# Patient Record
Sex: Male | Born: 1995 | Race: Black or African American | Hispanic: No | Marital: Single | Smoking: Never smoker
Health system: Southern US, Community
[De-identification: ages and names within clinical notes are randomized; demographics above are authoritative.]

---
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
## 2017-01-28 IMAGING — DX DG FINGER MIDDLE 2+V*R*
3 series · 3 of 3 positions shown · non-contrast
Comparison: None.

CLINICAL DATA: Basketball injury, right middle finger trauma, pain

EXAM:
RIGHT MIDDLE FINGER 2+V

[finger ap]
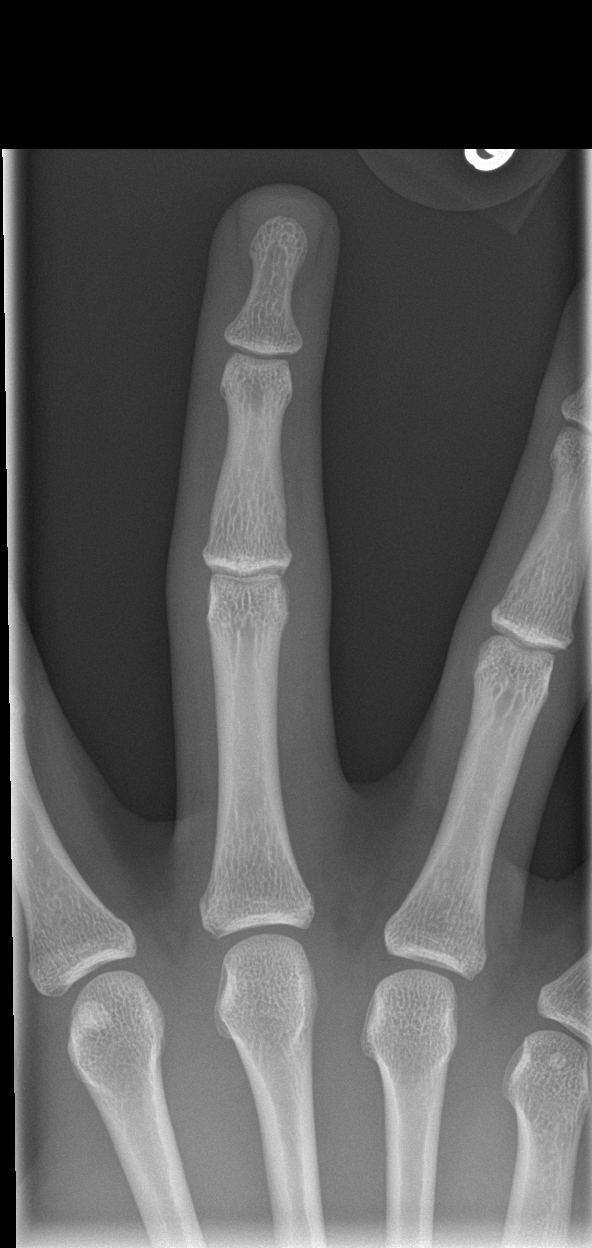

[finger obl]
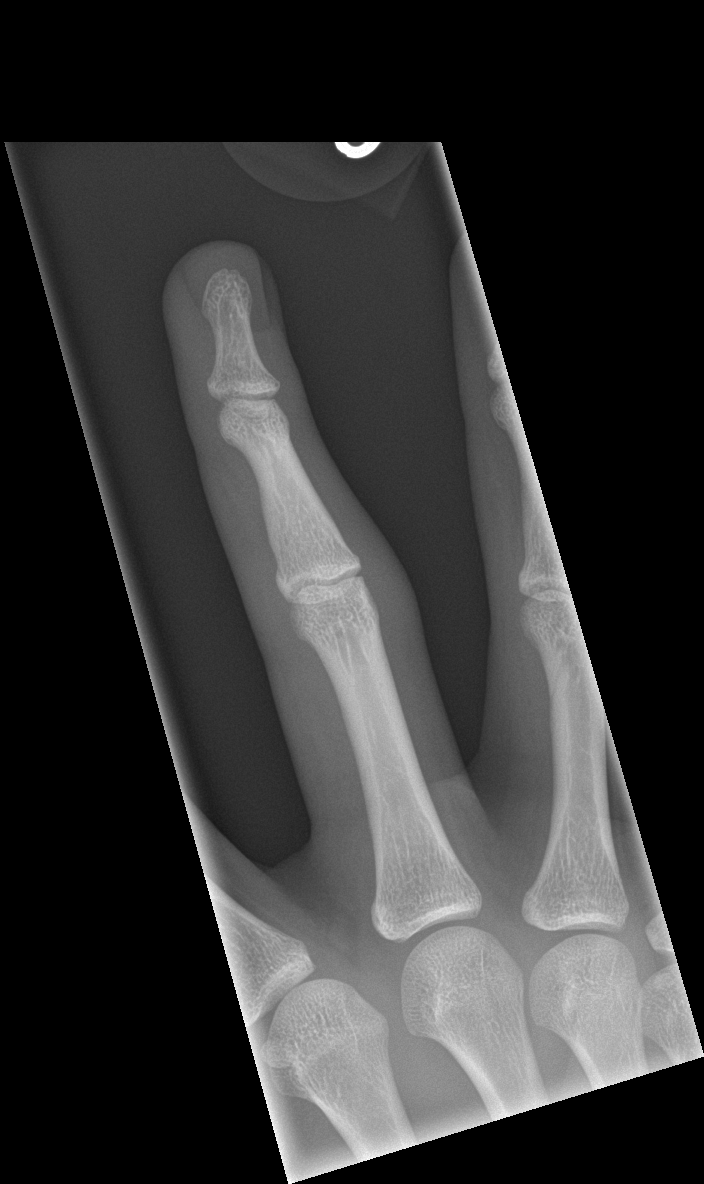

[finger lat]
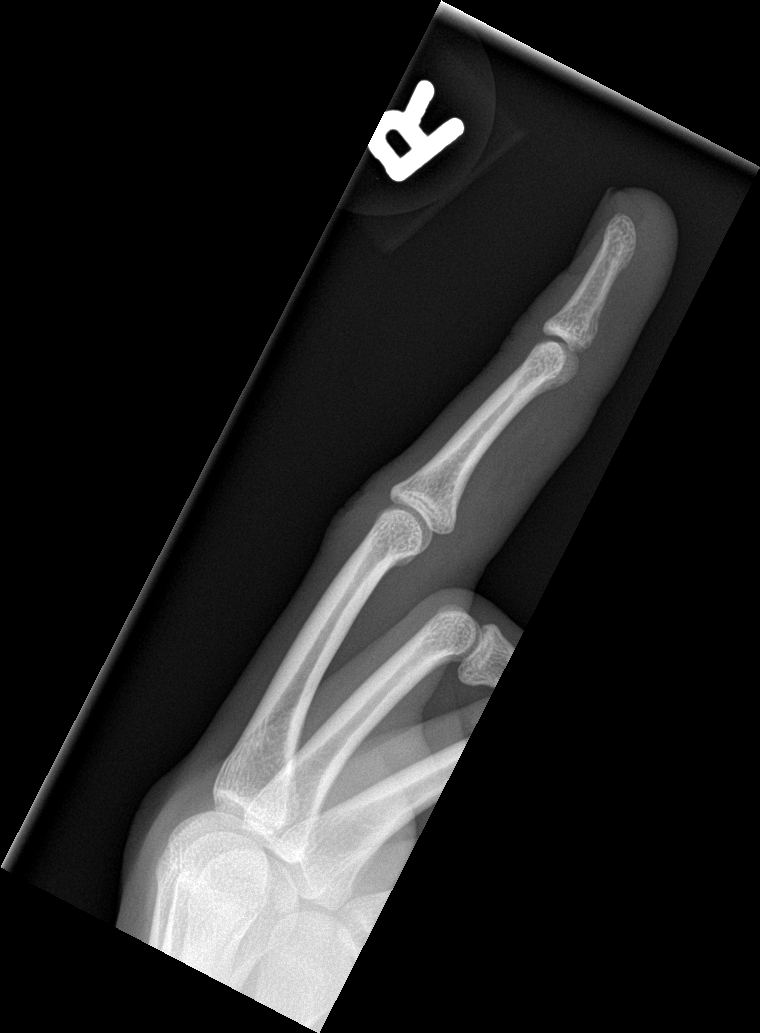

[3 of 3 positions shown; findings below may reference images not displayed]

FINDINGS: Mild diffuse soft tissue swelling without acute osseous finding or
fracture. Normal alignment. No Joint abnormality.
IMPRESSION: Soft tissue swelling without acute osseous finding
